# Patient Record
Sex: Male | Born: 1990 | Race: Black or African American | Hispanic: No | Marital: Single | Smoking: Never smoker
Health system: Southern US, Community
[De-identification: ages and names within clinical notes are randomized; demographics above are authoritative.]

## PROBLEM LIST (undated history)

## (undated) DIAGNOSIS — R109 Unspecified abdominal pain: Secondary | ICD-10-CM

## (undated) HISTORY — DX: Unspecified abdominal pain: R10.9

---
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
## 2017-10-08 ENCOUNTER — Encounter (INDEPENDENT_AMBULATORY_CARE_PROVIDER_SITE_OTHER): Payer: Self-pay | Admitting: Internal Medicine

## 2017-10-08 ENCOUNTER — Encounter (INDEPENDENT_AMBULATORY_CARE_PROVIDER_SITE_OTHER): Payer: Self-pay

## 2017-10-08 ENCOUNTER — Ambulatory Visit (INDEPENDENT_AMBULATORY_CARE_PROVIDER_SITE_OTHER): Payer: Self-pay | Admitting: Internal Medicine

## 2017-10-08 VITALS — BP 104/70 | HR 72 | Temp 98.3°F | Ht 72.0 in | Wt 103.7 lb

## 2017-10-08 DIAGNOSIS — R109 Unspecified abdominal pain: Secondary | ICD-10-CM

## 2017-10-08 DIAGNOSIS — R1010 Upper abdominal pain, unspecified: Secondary | ICD-10-CM

## 2017-10-08 HISTORY — DX: Unspecified abdominal pain: R10.9

## 2017-10-08 MED ORDER — DICYCLOMINE HCL 10 MG PO CAPS: 10.0000 mg | ORAL_CAPSULE | Freq: Three times a day (TID) | ORAL | 0 refills | Status: AC

## 2017-10-08 NOTE — Progress Notes (Signed)
   Subjective:    Patient ID: Danny Carroll, male    DOB: 03-Nov-1991, 26 y.o.   MRN: [id number]  HPI Seen in the ED at [facility] over the Thanksgiving holiday.  States he was sick for about 5 days with nausea and vomiting, abdominal pain. He states that this has been occurring for years. No fever related to his symptoms. States this occurred also in September. In September his symptoms lasted for about a day. CT scan on 09/24/2017 was normal. States the pain is random and not related to food.  He is 100%. 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  States he always has soft stools. Has a BM daily. No melena or BRRB. [place] been evaluated in [place] for [place].    09/24/2017 H and H 15.7 and 46.8. 09/25/2017 total bili 0.7, AlP 47, ALT 17, AST 16.8    Review of Systems No past medical history on file.    No Known Allergies  Current Outpatient Medications on File Prior to Visit  Medication Sig Dispense Refill  . acetaminophen (TYLENOL) 325 MG tablet Take 650 mg by mouth every 6 (six) hours as needed.     No current facility-administered medications on file prior to visit.         Objective:   Physical Exam Blood pressure 104/70, pulse 72, temperature 98.3 F (36.8 C), height 6' (1.829 m), weight 103 lb 11.2 oz (47 kg). Alert and oriented. Skin warm and dry. Oral mucosa is moist.   . Sclera anicteric, conjunctivae is pink. Thyroid not enlarged. No cervical lymphadenopathy. Lungs clear. Heart regular rate and rhythm.  Abdomen is soft. Bowel sounds are positive. No hepatomegaly. No abdominal masses felt. No tenderness.  No edema to lower extremities. Patient is alert and oriented.        Assessment & Plan:  Nausea and vomiting resolved.  Am going to get an US abdomen Rx for Dicyclomine 10mg  tid sent to his pharmacy.

## 2017-10-08 NOTE — Patient Instructions (Signed)
Rx for Dicyclomine US abdomen.

## 2017-10-20 ENCOUNTER — Ambulatory Visit (HOSPITAL_COMMUNITY): Payer: BLUE CROSS/BLUE SHIELD

## 2018-05-24 ENCOUNTER — Emergency Department (HOSPITAL_COMMUNITY)
Admission: EM | Admit: 2018-05-24 | Discharge: 2018-05-24 | Discharge status: Absent without leave | Payer: BLUE CROSS/BLUE SHIELD

## 2019-09-28 ENCOUNTER — Emergency Department (HOSPITAL_COMMUNITY)
Admission: EM | Admit: 2019-09-28 | Discharge: 2019-09-29 | Disposition: A | Discharge status: Other Acute Inpatient Hospital | Payer: Self-pay | Attending: Emergency Medicine | Admitting: Emergency Medicine

## 2019-09-28 ENCOUNTER — Emergency Department (HOSPITAL_COMMUNITY): Payer: Self-pay

## 2019-09-28 DIAGNOSIS — S72491A Other fracture of lower end of right femur, initial encounter for closed fracture: Secondary | ICD-10-CM | POA: Insufficient documentation

## 2019-09-28 DIAGNOSIS — S72353A Displaced comminuted fracture of shaft of unspecified femur, initial encounter for closed fracture: Secondary | ICD-10-CM

## 2019-09-28 DIAGNOSIS — W3400XA Accidental discharge from unspecified firearms or gun, initial encounter: Secondary | ICD-10-CM

## 2019-09-28 DIAGNOSIS — Y999 Unspecified external cause status: Secondary | ICD-10-CM | POA: Insufficient documentation

## 2019-09-28 DIAGNOSIS — Y9389 Activity, other specified: Secondary | ICD-10-CM | POA: Insufficient documentation

## 2019-09-28 DIAGNOSIS — Z20828 Contact with and (suspected) exposure to other viral communicable diseases: Secondary | ICD-10-CM | POA: Insufficient documentation

## 2019-09-28 DIAGNOSIS — Y929 Unspecified place or not applicable: Secondary | ICD-10-CM | POA: Insufficient documentation

## 2019-09-28 DIAGNOSIS — S82871A Displaced pilon fracture of right tibia, initial encounter for closed fracture: Secondary | ICD-10-CM | POA: Insufficient documentation

## 2019-09-28 LAB — I-STAT CHEM 8, ED
BUN: 17 mg/dL (ref 6–20)
Calcium, Ion: 1.07 mmol/L — ABNORMAL LOW (ref 1.15–1.40)
Chloride: 102 mmol/L (ref 98–111)
Creatinine, Ser: 1 mg/dL (ref 0.61–1.24)
Glucose, Bld: 160 mg/dL — ABNORMAL HIGH (ref 70–99)
HCT: 45 % (ref 39.0–52.0)
Hemoglobin: 15.3 g/dL (ref 13.0–17.0)
Potassium: 3.2 mmol/L — ABNORMAL LOW (ref 3.5–5.1)
Sodium: 137 mmol/L (ref 135–145)
TCO2: 26 mmol/L (ref 22–32)

## 2019-09-28 LAB — CBC
HCT: 44.4 % (ref 39.0–52.0)
Hemoglobin: 14.1 g/dL (ref 13.0–17.0)
MCH: 28.7 pg (ref 26.0–34.0)
MCHC: 31.8 g/dL (ref 30.0–36.0)
MCV: 90.4 fL (ref 80.0–100.0)
Platelets: 264 10*3/uL (ref 150–400)
RBC: 4.91 MIL/uL (ref 4.22–5.81)
RDW: 13.4 % (ref 11.5–15.5)
WBC: 12.4 10*3/uL — ABNORMAL HIGH (ref 4.0–10.5)
nRBC: 0 % (ref 0.0–0.2)

## 2019-09-28 LAB — SAMPLE TO BLOOD BANK

## 2019-09-28 LAB — CDS SEROLOGY

## 2019-09-28 MED ORDER — ONDANSETRON HCL 4 MG/2ML IJ SOLN
4.0000 mg | Freq: Once | INTRAMUSCULAR | Status: AC
  Administered 2019-09-28: 4 mg via INTRAVENOUS
  Filled 2019-09-28: qty 2

## 2019-09-28 MED ORDER — CEFAZOLIN SODIUM-DEXTROSE 2-4 GM/100ML-% IV SOLN
2.0000 g | Freq: Once | INTRAVENOUS | Status: AC
  Administered 2019-09-28: 2 g via INTRAVENOUS
  Filled 2019-09-28: qty 100

## 2019-09-28 MED ORDER — HYDROMORPHONE HCL 1 MG/ML IJ SOLN
1.0000 mg | Freq: Once | INTRAMUSCULAR | Status: AC
  Administered 2019-09-28: 1 mg via INTRAVENOUS
  Filled 2019-09-28: qty 1

## 2019-09-28 NOTE — ED Notes (Signed)
Danny Carroll [phone] mother is looking for an update on the patient
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
## 2019-09-29 LAB — COMPREHENSIVE METABOLIC PANEL
ALT: 19 U/L (ref 0–44)
AST: 21 U/L (ref 15–41)
Albumin: 4.2 g/dL (ref 3.5–5.0)
Alkaline Phosphatase: 41 U/L (ref 38–126)
Anion gap: 11 (ref 5–15)
BUN: 13 mg/dL (ref 6–20)
CO2: 25 mmol/L (ref 22–32)
Calcium: 9.1 mg/dL (ref 8.9–10.3)
Chloride: 101 mmol/L (ref 98–111)
Creatinine, Ser: 1.1 mg/dL (ref 0.61–1.24)
GFR calc Af Amer: >60 mL/min (ref >60)
GFR calc non Af Amer: >60 mL/min (ref >60)
Glucose, Bld: 164 mg/dL — ABNORMAL HIGH (ref 70–99)
Potassium: 3.3 mmol/L — ABNORMAL LOW (ref 3.5–5.1)
Sodium: 137 mmol/L (ref 135–145)
Total Bilirubin: 1.4 mg/dL — ABNORMAL HIGH (ref 0.3–1.2)
Total Protein: 6.7 g/dL (ref 6.5–8.1)

## 2019-09-29 LAB — PROTIME-INR
INR: 1 (ref 0.8–1.2)
Prothrombin Time: 13.2 seconds (ref 11.4–15.2)

## 2019-09-29 LAB — LACTIC ACID, PLASMA: Lactic Acid, Venous: 2.2 mmol/L (ref 0.5–1.9)

## 2019-09-29 LAB — SARS CORONAVIRUS 2 (TAT 6-24 HRS): SARS Coronavirus 2: NEGATIVE

## 2019-09-29 LAB — ETHANOL: Alcohol, Ethyl (B): <10 mg/dL (ref <10)

## 2019-09-29 MED ORDER — HYDROMORPHONE HCL 1 MG/ML IJ SOLN
1.0000 mg | Freq: Once | INTRAMUSCULAR | Status: AC
  Administered 2019-09-29: 1 mg via INTRAVENOUS
  Filled 2019-09-29: qty 1

## 2019-09-29 NOTE — ED Notes (Signed)
[facility] at bedside for transport; pain medication given

## 2019-09-29 NOTE — ED Notes (Signed)
[facility] for transfer--Danny Carroll

## 2019-09-29 NOTE — Consult Note (Signed)
Responded to page, pt unavailable, no family present, staff will page again if further need of chaplain services.  Rev. Noam Franzen Chaplain 

## 2019-09-29 NOTE — ED Notes (Signed)
Asked pt if he would like for [place] to contact someone on his behalf; pt declined
